# Patient Record
Sex: Female | Born: 1985 | Race: White | Hispanic: No | Marital: Married | Smoking: Never smoker
Health system: Southern US, Community
[De-identification: ages and names within clinical notes are randomized; demographics above are authoritative.]

## PROBLEM LIST (undated history)

## (undated) DIAGNOSIS — M419 Scoliosis, unspecified: Secondary | ICD-10-CM

## (undated) DIAGNOSIS — Z981 Arthrodesis status: Secondary | ICD-10-CM

## (undated) DIAGNOSIS — T7840XA Allergy, unspecified, initial encounter: Secondary | ICD-10-CM

## (undated) HISTORY — PX: LUMBAR FUSION: SHX111

## (undated) HISTORY — DX: Allergy, unspecified, initial encounter: T78.40XA

## (undated) HISTORY — PX: MOUTH SURGERY: SHX715

---

## 2010-02-26 HISTORY — PX: SPINAL FUSION: SHX223

## 2010-04-12 ENCOUNTER — Encounter: Payer: Self-pay | Admitting: Internal Medicine

## 2010-04-12 ENCOUNTER — Ambulatory Visit (INDEPENDENT_AMBULATORY_CARE_PROVIDER_SITE_OTHER): Payer: BC Managed Care – PPO | Admitting: Internal Medicine

## 2010-04-12 VITALS — BP 120/80 | HR 105 | Temp 98.6°F | Ht 66.0 in | Wt 143.0 lb

## 2010-04-12 DIAGNOSIS — Z0000 Encounter for general adult medical examination without abnormal findings: Secondary | ICD-10-CM

## 2010-04-12 DIAGNOSIS — R209 Unspecified disturbances of skin sensation: Secondary | ICD-10-CM

## 2010-04-12 DIAGNOSIS — R202 Paresthesia of skin: Secondary | ICD-10-CM

## 2010-04-12 LAB — TSH: TSH: 4.64 u[IU]/mL (ref 0.35–5.50)

## 2010-04-12 LAB — BASIC METABOLIC PANEL
BUN: 10 mg/dL (ref 6–23)
Calcium: 9.7 mg/dL (ref 8.4–10.5)
Chloride: 106 mEq/L (ref 96–112)
Creatinine, Ser: 0.9 mg/dL (ref 0.4–1.2)

## 2010-04-12 MED ORDER — TETANUS-DIPHTH-ACELL PERTUSSIS 5-2-15.5 LF-MCG/0.5 IM SUSP
0.5000 mL | Freq: Once | INTRAMUSCULAR | Status: AC
  Administered 2010-04-12: 0.5 mL via INTRAMUSCULAR

## 2010-04-12 NOTE — Assessment & Plan Note (Signed)
Obtain CBC,chem7, TSH and B12. Suspect superficial nerve impingement at ankle or foot. Avoid tennis shoes x1 month and monitor sx for resolution. Consider associated lumbar radiculopathy but feel low likelihood given presentation. Followup if no improvement or worsening.

## 2010-04-12 NOTE — Progress Notes (Signed)
  Subjective:    Patient ID: Raynaldo Opitz, female    DOB: 12-04-1985, 25 y.o.   MRN: [id number]  HPI Pt is a pleasant 25 yo female who presents to clinic for evaluation of foot numbness. Notes 2 wk h/o left plantar foot numbness without injury or trauma. H/o spinal fusion @ age 20 but has had no recent back pain or radicular leg pain. Paresthesia involves entire plantar foot but not side or dorsal. No other location of paresthesias. No exacerbating factors. Changed shoes from tennis shoes to flats or sandals recently and sx has spontaneously improved.  Has taken no medication for this. No significant PMH. Father diagnosed recently with peripheral neuropathy of unknown etiology. Sees gyn yearly for pap smears and cpe. Needs updated tetanus for work.  Reviewed PMH, PSH, medications, allergies, family hx and social hx.    Review of Systems  Constitutional: Negative for fever, chills and fatigue.  HENT: Negative for hearing loss, congestion, facial swelling and neck stiffness.   Eyes: Negative for pain, discharge, redness and visual disturbance.  Respiratory: Negative for cough, shortness of breath and wheezing.   Cardiovascular: Negative for chest pain and palpitations.  Gastrointestinal: Negative for nausea, abdominal pain and blood in stool.  Genitourinary: Negative for dysuria, frequency and difficulty urinating.  Musculoskeletal: Negative for back pain, joint swelling, arthralgias and gait problem.  Skin: Negative for color change, pallor, rash and wound.  Neurological: Positive for numbness. Negative for dizziness, tremors, seizures, speech difficulty, weakness and headaches.  Hematological: Negative for adenopathy. Does not bruise/bleed easily.  Psychiatric/Behavioral: Negative for behavioral problems and agitation. The patient is not nervous/anxious.        Objective:   Physical Exam  Constitutional: She appears well-developed and well-nourished. No distress.  HENT:  Head:  Normocephalic and atraumatic.  Right Ear: Tympanic membrane and external ear normal.  Left Ear: Tympanic membrane and external ear normal.  Nose: Nose normal.  Mouth/Throat: Oropharynx is clear and moist. No oropharyngeal exudate.  Eyes: Conjunctivae and EOM are normal. Pupils are equal, round, and reactive to light. Right eye exhibits no discharge. Left eye exhibits no discharge. No scleral icterus.  Neck: Neck supple. No thyromegaly present.  Cardiovascular: Normal rate, regular rhythm and normal heart sounds.  Exam reveals no gallop and no friction rub.   No murmur heard. Pulmonary/Chest: Effort normal and breath sounds normal. No respiratory distress. She has no wheezes. She has no rales.  Lymphadenopathy:    She has no cervical adenopathy.  Neurological: She is alert. No cranial nerve deficit. Coordination normal.       Left foot: +2 DP pulse. Sensation to light touch intact. Bilateral LE strength 5/5  Skin: Skin is warm. No rash noted. She is not diaphoretic. No erythema.  Psychiatric: She has a normal mood and affect.          Assessment & Plan:

## 2010-04-13 ENCOUNTER — Telehealth: Payer: Self-pay

## 2010-04-13 LAB — CBC WITH DIFFERENTIAL/PLATELET
Basophils Absolute: 0 10*3/uL (ref 0.0–0.1)
Basophils Relative: 0.3 % (ref 0.0–3.0)
Eosinophils Absolute: 0.1 10*3/uL (ref 0.0–0.7)
HCT: 41.6 % (ref 36.0–46.0)
Hemoglobin: 14.4 g/dL (ref 12.0–15.0)
Lymphocytes Relative: 39.7 % (ref 12.0–46.0)
Lymphs Abs: 1.9 10*3/uL (ref 0.7–4.0)
MCHC: 34.5 g/dL (ref 30.0–36.0)
MCV: 88.9 fl (ref 78.0–100.0)
Monocytes Absolute: 0.3 10*3/uL (ref 0.1–1.0)
Neutro Abs: 2.5 10*3/uL (ref 1.4–7.7)
RBC: 4.68 Mil/uL (ref 3.87–5.11)
RDW: 12.7 % (ref 11.5–14.6)

## 2010-04-13 NOTE — Telephone Encounter (Signed)
Message copied by Kyung Rudd on Thu Apr 13, 2010  2:29 PM ------      Message from: Letitia Libra, Maisie Fus      Created: Thu Apr 13, 2010  2:17 PM       Labs nl

## 2010-04-13 NOTE — Telephone Encounter (Signed)
Pt notified labs normal.

## 2012-03-02 ENCOUNTER — Encounter (HOSPITAL_COMMUNITY): Payer: Self-pay | Admitting: Emergency Medicine

## 2012-03-02 ENCOUNTER — Emergency Department (HOSPITAL_COMMUNITY)
Admission: EM | Admit: 2012-03-02 | Discharge: 2012-03-02 | Disposition: A | Discharge status: Home / Self Care | Payer: No Typology Code available for payment source | Attending: Emergency Medicine | Admitting: Emergency Medicine

## 2012-03-02 ENCOUNTER — Emergency Department (HOSPITAL_COMMUNITY): Payer: No Typology Code available for payment source

## 2012-03-02 DIAGNOSIS — S139XXA Sprain of joints and ligaments of unspecified parts of neck, initial encounter: Secondary | ICD-10-CM | POA: Insufficient documentation

## 2012-03-02 DIAGNOSIS — Z981 Arthrodesis status: Secondary | ICD-10-CM | POA: Insufficient documentation

## 2012-03-02 DIAGNOSIS — Z8739 Personal history of other diseases of the musculoskeletal system and connective tissue: Secondary | ICD-10-CM | POA: Insufficient documentation

## 2012-03-02 DIAGNOSIS — Y9389 Activity, other specified: Secondary | ICD-10-CM | POA: Insufficient documentation

## 2012-03-02 DIAGNOSIS — IMO0002 Reserved for concepts with insufficient information to code with codable children: Secondary | ICD-10-CM | POA: Insufficient documentation

## 2012-03-02 DIAGNOSIS — Z79899 Other long term (current) drug therapy: Secondary | ICD-10-CM | POA: Insufficient documentation

## 2012-03-02 HISTORY — DX: Scoliosis, unspecified: M41.9

## 2012-03-02 MED ORDER — NAPROXEN 375 MG PO TABS: 375.0000 mg | ORAL_TABLET | Freq: Two times a day (BID) | ORAL | Status: AC

## 2012-03-02 MED ORDER — CYCLOBENZAPRINE HCL 10 MG PO TABS: 10.0000 mg | ORAL_TABLET | Freq: Two times a day (BID) | ORAL | Status: DC | PRN

## 2012-03-02 MED ORDER — HYDROCODONE-ACETAMINOPHEN 5-325 MG PO TABS: 1.0000 | ORAL_TABLET | ORAL | Status: DC | PRN
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
## 2012-03-02 NOTE — ED Notes (Signed)
Patient transported to X-ray 
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
## 2012-03-02 NOTE — ED Provider Notes (Signed)
Medical screening examination/treatment/procedure(s) were performed by non-physician practitioner and as supervising physician I was immediately available for consultation/collaboration.  Hadi Dubin, MD 03/02/12 2345 
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
## 2012-06-16 ENCOUNTER — Other Ambulatory Visit: Payer: Self-pay | Admitting: Family Medicine

## 2012-06-16 DIAGNOSIS — R110 Nausea: Secondary | ICD-10-CM

## 2012-06-20 ENCOUNTER — Other Ambulatory Visit: Payer: Self-pay | Admitting: Family Medicine

## 2012-06-20 ENCOUNTER — Ambulatory Visit
Admission: RE | Admit: 2012-06-20 | Discharge: 2012-06-20 | Disposition: A | Discharge status: Home / Self Care | Payer: BC Managed Care – PPO | Source: Ambulatory Visit | Attending: Family Medicine | Admitting: Family Medicine

## 2012-06-20 DIAGNOSIS — R110 Nausea: Secondary | ICD-10-CM

## 2012-07-10 ENCOUNTER — Other Ambulatory Visit: Payer: Self-pay | Admitting: Gastroenterology

## 2012-07-10 DIAGNOSIS — K589 Irritable bowel syndrome without diarrhea: Secondary | ICD-10-CM

## 2012-07-23 ENCOUNTER — Other Ambulatory Visit: Payer: BC Managed Care – PPO

## 2012-07-23 ENCOUNTER — Ambulatory Visit
Admission: RE | Admit: 2012-07-23 | Discharge: 2012-07-23 | Disposition: A | Discharge status: Home / Self Care | Payer: BC Managed Care – PPO | Source: Ambulatory Visit | Attending: Gastroenterology | Admitting: Gastroenterology

## 2012-07-23 DIAGNOSIS — K589 Irritable bowel syndrome without diarrhea: Secondary | ICD-10-CM

## 2013-11-10 LAB — OB RESULTS CONSOLE ABO/RH: RH Type: POSITIVE

## 2013-11-10 LAB — OB RESULTS CONSOLE RUBELLA ANTIBODY, IGM: Rubella: IMMUNE

## 2013-11-10 LAB — OB RESULTS CONSOLE GC/CHLAMYDIA
Chlamydia: NEGATIVE
Gonorrhea: NEGATIVE

## 2013-11-10 LAB — OB RESULTS CONSOLE HIV ANTIBODY (ROUTINE TESTING): HIV: NONREACTIVE

## 2013-11-10 LAB — OB RESULTS CONSOLE HEPATITIS B SURFACE ANTIGEN: Hepatitis B Surface Ag: NEGATIVE

## 2013-11-10 LAB — OB RESULTS CONSOLE ANTIBODY SCREEN: ANTIBODY SCREEN: NEGATIVE

## 2014-02-26 NOTE — L&D Delivery Note (Signed)
Delivery Note At 5:18 AM a viable and healthy female was delivered via Vaginal, Spontaneous Delivery (Presentation: Right Occiput Anterior).  APGAR: 8, 9; weight  pending.   Placenta status: Intact, Spontaneous.  Cord:  with the following complications: tight [place] x one clamped on perineum.  Cord pH: na  Anesthesia: Epidural  Episiotomy: None Lacerations: Periurethral Suture Repair: 2.0 3.0 vicryl rapide Est. Blood Loss (mL): 200  Mom to postpartum.  Baby to Couplet care / Skin to Skin.  Valerie Cobb J 06/09/2014, 5:33 AM

## 2014-03-24 LAB — OB RESULTS CONSOLE RPR: RPR: NONREACTIVE

## 2014-05-14 IMAGING — RF DG UGI W/ HIGH DENSITY W/KUB
19 of 24 series · 19 of 24 positions shown · non-contrast
Comparison: None.

CLINICAL DATA: Irritable bowel syndrome.

UPPER GI SERIES W/HIGH DENSITY W/KUB
TECHNIQUE: After obtaining a scout radiograph, upper GI series
performed with high density barium and effervescent agent. Thin
barium also used.
Fluoroscopy Time: 2 minutes, 54 seconds.

[Series 1: run · 1 of 1 slices shown (1 of 19)]
[im 1/1]
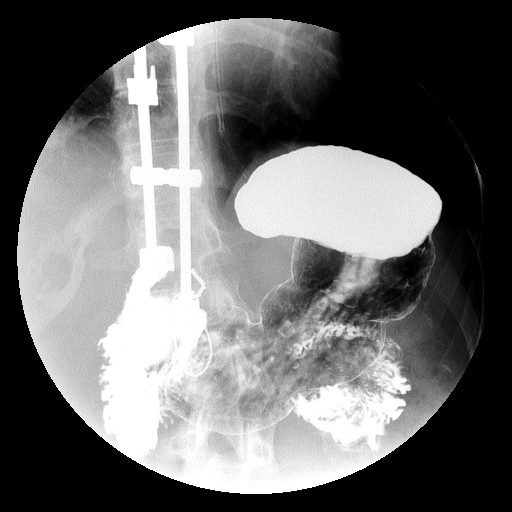

[Series 2: run · 1 of 1 slices shown (2 of 19)]
[im 1/1]
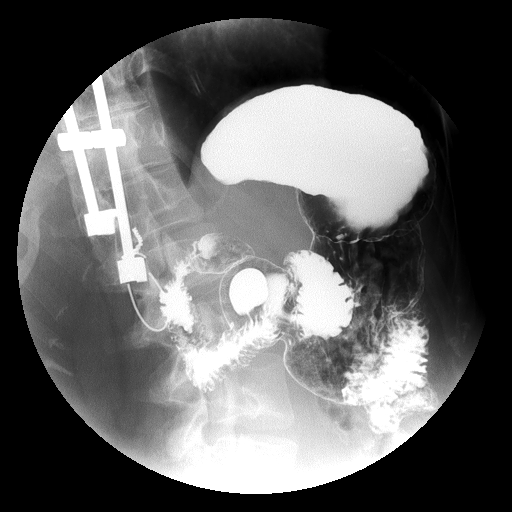

[Series 4: run · 1 of 1 slices shown (3 of 19)]
[im 1/1]
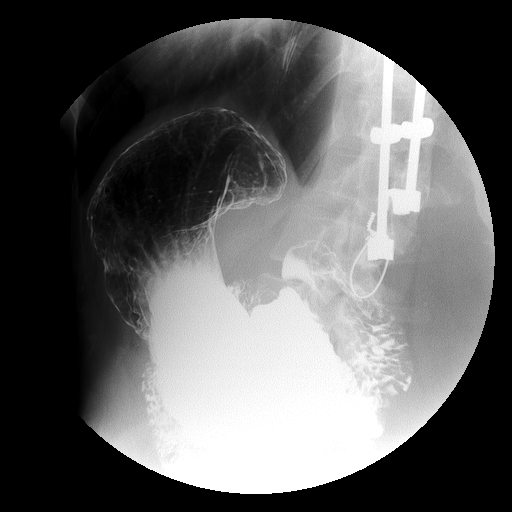

[Series 5: run · 1 of 1 slices shown (4 of 19)]
[im 1/1]
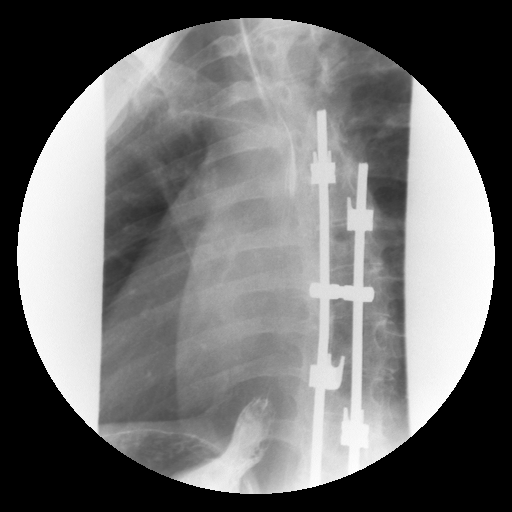

[Series 6: run · 1 of 1 slices shown (5 of 19)]
[im 1/1]
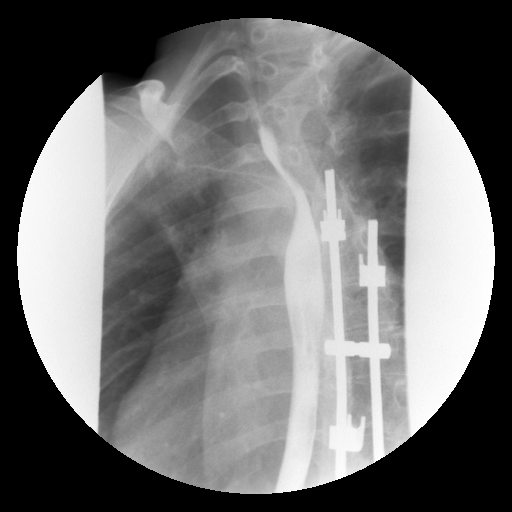

[Series 7: run · 1 of 1 slices shown (6 of 19)]
[im 1/1]
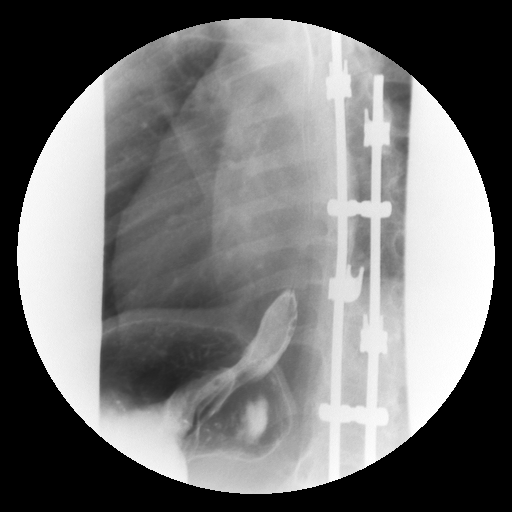

[Series 9: run · 1 of 1 slices shown (7 of 19)]
[im 1/1]
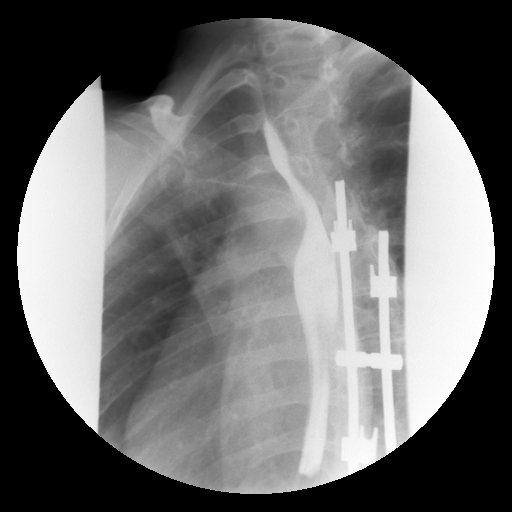

[Series 10: run · 1 of 1 slices shown (8 of 19)]
[im 1/1]
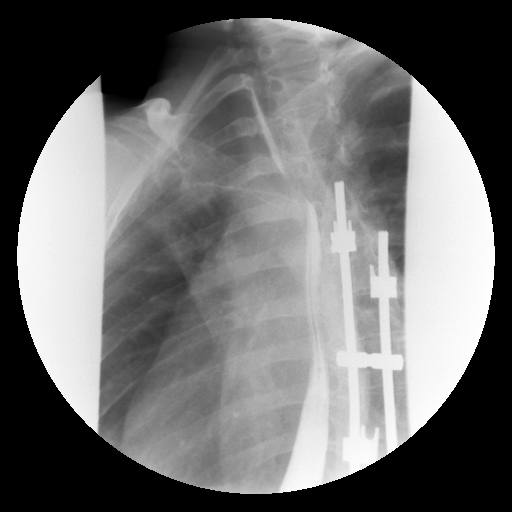

[Series 11: run · 1 of 1 slices shown (9 of 19)]
[im 1/1]
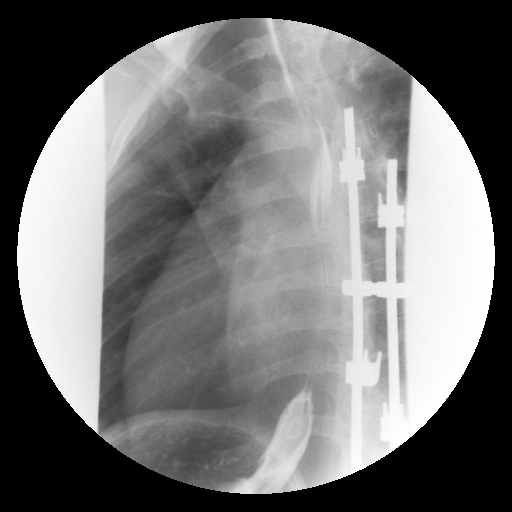

[Series 13: run · 1 of 1 slices shown (10 of 19)]
[im 1/1]
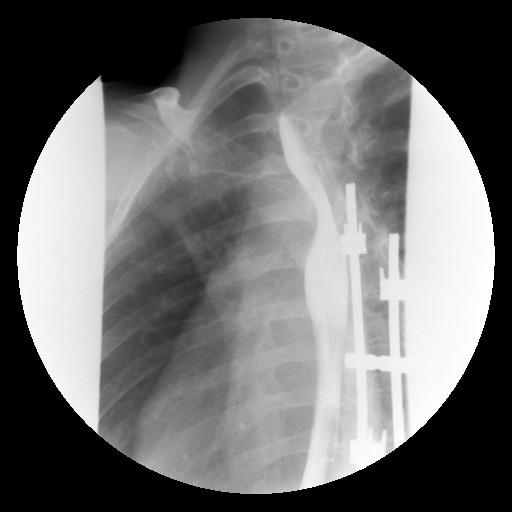

[Series 14: run · 1 of 1 slices shown (11 of 19)]
[im 1/1]
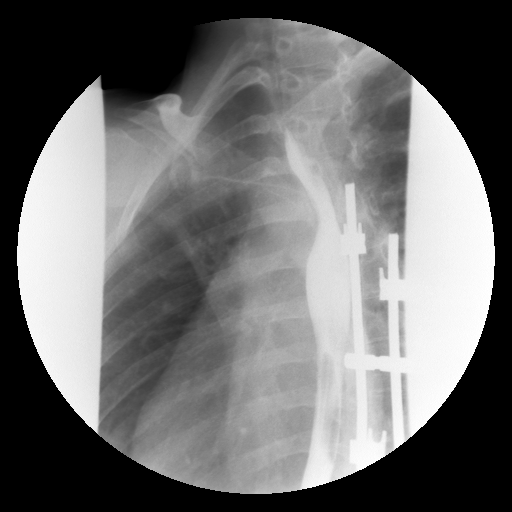

[Series 15: run · 1 of 1 slices shown (12 of 19)]
[im 1/1]
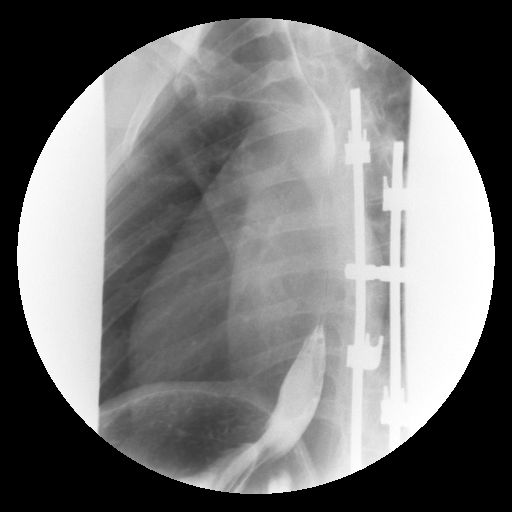

[Series 16: run · 1 of 1 slices shown (13 of 19)]
[im 1/1]
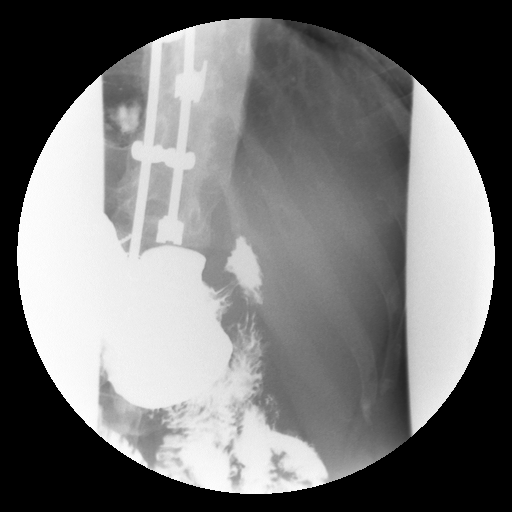

[Series 18: run · 1 of 1 slices shown (14 of 19)]
[im 1/1]
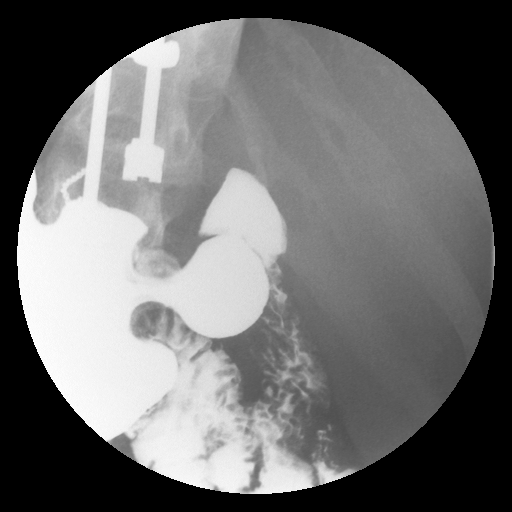

[Series 19: run · 1 of 1 slices shown (15 of 19)]
[im 1/1]
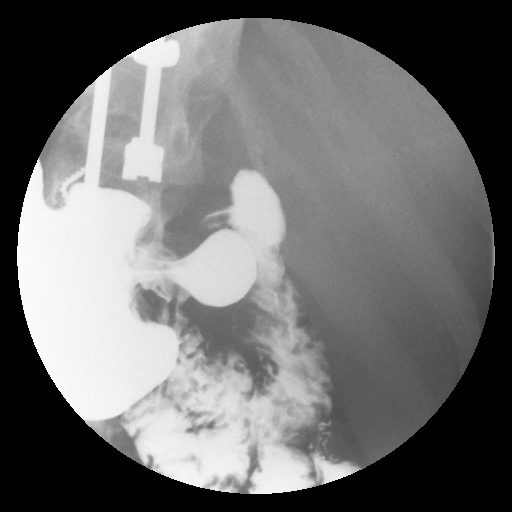

[Series 20: run · 1 of 1 slices shown (16 of 19)]
[im 1/1]
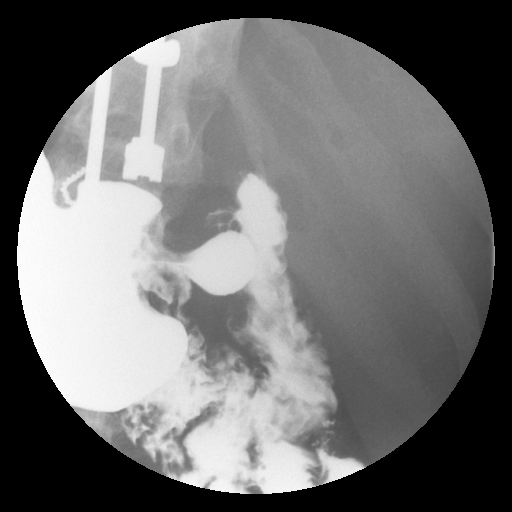

[Series 21: run · 1 of 1 slices shown (17 of 19)]
[im 1/1]
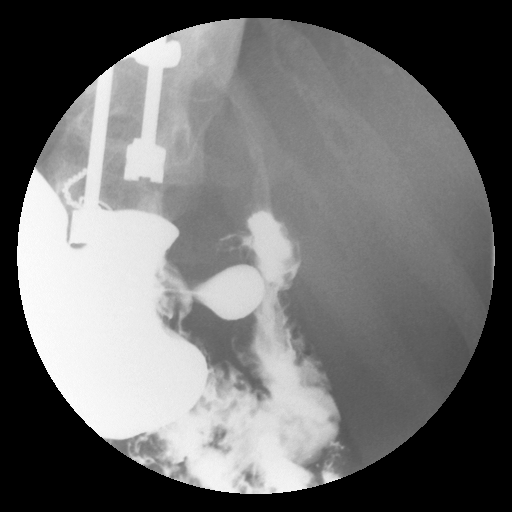

[Series 23: run · 1 of 1 slices shown (18 of 19)]
[im 1/1]
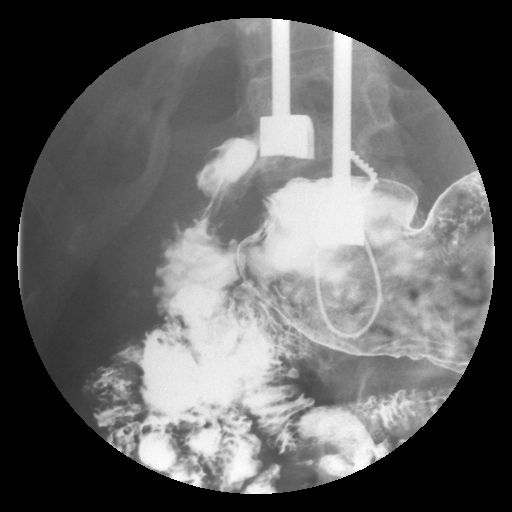

[Series 24: run · 1 of 1 slices shown (19 of 19)]
[im 1/1]
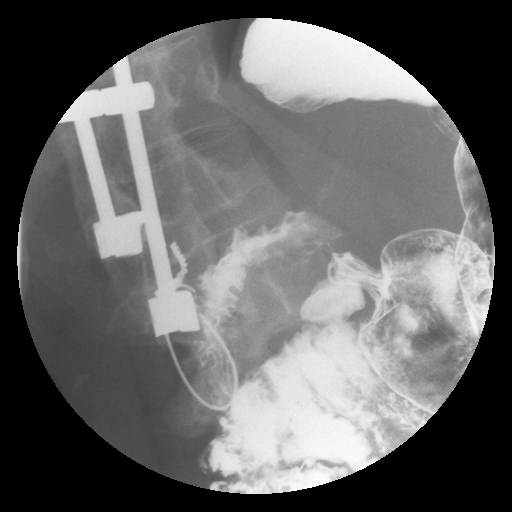

[19 of 24 positions shown; findings below may reference images not displayed]

FINDINGS: Fluoroscopic evaluation of swallowing demonstrates
disruption of primary esophageal peristaltic waves with proximal
escape.  No fixed stricture, fold thickening or reflux.

Stomach, duodenal bulb are unremarkable.  There is a small duodenal
diverticulum within the fourth portion of the duodenum.
IMPRESSION: Small duodenal diverticulum.

Nonspecific esophageal motility disorder.

## 2014-05-17 LAB — OB RESULTS CONSOLE GBS: GBS: NEGATIVE

## 2014-06-08 ENCOUNTER — Encounter (HOSPITAL_COMMUNITY): Payer: Self-pay | Admitting: *Deleted

## 2014-06-08 ENCOUNTER — Other Ambulatory Visit: Payer: Self-pay | Admitting: Obstetrics and Gynecology

## 2014-06-08 ENCOUNTER — Inpatient Hospital Stay (HOSPITAL_COMMUNITY)
Admission: AD | Admit: 2014-06-08 | Discharge: 2014-06-11 | DRG: 775 | Disposition: A | Discharge status: Home / Self Care | Payer: BLUE CROSS/BLUE SHIELD | Source: Ambulatory Visit | Attending: Obstetrics and Gynecology | Admitting: Obstetrics and Gynecology

## 2014-06-08 DIAGNOSIS — Z3A38 38 weeks gestation of pregnancy: Secondary | ICD-10-CM | POA: Diagnosis present

## 2014-06-08 DIAGNOSIS — O365930 Maternal care for other known or suspected poor fetal growth, third trimester, not applicable or unspecified: Principal | ICD-10-CM | POA: Diagnosis present

## 2014-06-08 DIAGNOSIS — Z3490 Encounter for supervision of normal pregnancy, unspecified, unspecified trimester: Secondary | ICD-10-CM

## 2014-06-08 DIAGNOSIS — Z3403 Encounter for supervision of normal first pregnancy, third trimester: Secondary | ICD-10-CM | POA: Diagnosis present

## 2014-06-08 DIAGNOSIS — O9081 Anemia of the puerperium: Secondary | ICD-10-CM | POA: Diagnosis present

## 2014-06-08 HISTORY — DX: Arthrodesis status: Z98.1

## 2014-06-08 LAB — TYPE AND SCREEN
ABO/RH(D): O POS
Antibody Screen: NEGATIVE

## 2014-06-08 LAB — CBC
HCT: 40.5 % (ref 36.0–46.0)
HEMOGLOBIN: 14.3 g/dL (ref 12.0–15.0)
MCH: 31.9 pg (ref 26.0–34.0)
MCHC: 35.3 g/dL (ref 30.0–36.0)
MCV: 90.4 fL (ref 78.0–100.0)
Platelets: 173 10*3/uL (ref 150–400)
RBC: 4.48 MIL/uL (ref 3.87–5.11)
RDW: 13.2 % (ref 11.5–15.5)
WBC: 10.2 10*3/uL (ref 4.0–10.5)

## 2014-06-08 MED ORDER — ACETAMINOPHEN 325 MG PO TABS: 650.0000 mg | ORAL_TABLET | ORAL | Status: DC | PRN

## 2014-06-08 MED ORDER — ONDANSETRON HCL 4 MG/2ML IJ SOLN: 4.0000 mg | Freq: Four times a day (QID) | INTRAMUSCULAR | Status: DC | PRN

## 2014-06-08 MED ORDER — LIDOCAINE HCL (PF) 1 % IJ SOLN
30.0000 mL | INTRAMUSCULAR | Status: DC | PRN
  Filled 2014-06-08: qty 30

## 2014-06-08 MED ORDER — OXYTOCIN 40 UNITS IN LACTATED RINGERS INFUSION - SIMPLE MED
62.5000 mL/h | INTRAVENOUS | Status: DC
  Filled 2014-06-08: qty 1000

## 2014-06-08 MED ORDER — MISOPROSTOL 25 MCG QUARTER TABLET
25.0000 ug | ORAL_TABLET | ORAL | Status: DC | PRN
  Administered 2014-06-08: 25 ug via VAGINAL
  Filled 2014-06-08: qty 0.25
  Filled 2014-06-08: qty 1
  Filled 2014-06-08: qty 0.25

## 2014-06-08 MED ORDER — EPHEDRINE 5 MG/ML INJ
10.0000 mg | INTRAVENOUS | Status: DC | PRN
  Filled 2014-06-08: qty 2

## 2014-06-08 MED ORDER — DIPHENHYDRAMINE HCL 50 MG/ML IJ SOLN: 12.5000 mg | INTRAMUSCULAR | Status: DC | PRN

## 2014-06-08 MED ORDER — LACTATED RINGERS IV SOLN
500.0000 mL | Freq: Once | INTRAVENOUS | Status: AC
  Administered 2014-06-08: 500 mL via INTRAVENOUS

## 2014-06-08 MED ORDER — PHENYLEPHRINE 40 MCG/ML (10ML) SYRINGE FOR IV PUSH (FOR BLOOD PRESSURE SUPPORT)
80.0000 ug | PREFILLED_SYRINGE | INTRAVENOUS | Status: DC | PRN
  Filled 2014-06-08: qty 2
  Filled 2014-06-08: qty 20

## 2014-06-08 MED ORDER — TERBUTALINE SULFATE 1 MG/ML IJ SOLN: 0.2500 mg | Freq: Once | INTRAMUSCULAR | Status: AC | PRN

## 2014-06-08 MED ORDER — LACTATED RINGERS IV SOLN
INTRAVENOUS | Status: DC
  Administered 2014-06-08: 23:00:00 via INTRAVENOUS

## 2014-06-08 MED ORDER — LACTATED RINGERS IV SOLN
500.0000 mL | INTRAVENOUS | Status: DC | PRN
  Administered 2014-06-08: 500 mL via INTRAVENOUS

## 2014-06-08 MED ORDER — OXYCODONE-ACETAMINOPHEN 5-325 MG PO TABS: 2.0000 | ORAL_TABLET | ORAL | Status: DC | PRN

## 2014-06-08 MED ORDER — CITRIC ACID-SODIUM CITRATE 334-500 MG/5ML PO SOLN: 30.0000 mL | ORAL | Status: DC | PRN

## 2014-06-08 MED ORDER — ZOLPIDEM TARTRATE 5 MG PO TABS: 5.0000 mg | ORAL_TABLET | Freq: Every evening | ORAL | Status: DC | PRN

## 2014-06-08 MED ORDER — OXYCODONE-ACETAMINOPHEN 5-325 MG PO TABS: 1.0000 | ORAL_TABLET | ORAL | Status: DC | PRN

## 2014-06-08 MED ORDER — FENTANYL 2.5 MCG/ML BUPIVACAINE 1/10 % EPIDURAL INFUSION (WH - ANES)
14.0000 mL/h | INTRAMUSCULAR | Status: DC | PRN
  Administered 2014-06-09: 14 mL/h via EPIDURAL
  Filled 2014-06-08: qty 125

## 2014-06-08 MED ORDER — PHENYLEPHRINE 40 MCG/ML (10ML) SYRINGE FOR IV PUSH (FOR BLOOD PRESSURE SUPPORT)
80.0000 ug | PREFILLED_SYRINGE | INTRAVENOUS | Status: DC | PRN
  Filled 2014-06-08: qty 2

## 2014-06-08 MED ORDER — OXYTOCIN BOLUS FROM INFUSION
500.0000 mL | INTRAVENOUS | Status: DC
  Administered 2014-06-09: 500 mL via INTRAVENOUS

## 2014-06-08 MED ORDER — FLEET ENEMA 7-19 GM/118ML RE ENEM: 1.0000 | ENEMA | RECTAL | Status: DC | PRN

## 2014-06-08 NOTE — H&P (Signed)
Valerie OpitzJessica Cobb is a 29 y.o. female presenting for IOL for IUGR. Sono today with poor interval growth and EFW < 7th percentile with AC 1st percentile and low afi. Delivery recommended.  Maternal Medical History:  Contractions: Onset was less than 1 hour ago.   Frequency: irregular.   Perceived severity is mild.    Fetal activity: Perceived fetal activity is normal.    Prenatal complications: IUGR.   Prenatal Complications - Diabetes: none.    OB History    Gravida Para Term Preterm AB TAB SAB Ectopic Multiple Living   1              Past Medical History  Diagnosis Date  . Allergy   . Scoliosis    Past Surgical History  Procedure Laterality Date  . Spinal fusion  13  . Lumbar fusion      midback fusion.- 6 rods in back  . Mouth surgery     Family History: family history includes Arthritis in her maternal grandmother; Cancer in her maternal grandfather; Hypertension in her father; Lupus in her maternal grandmother; Neuropathy in her father. Social History:  reports that she has never smoked. She has never used smokeless tobacco. She reports that she does not drink alcohol or use illicit drugs.   Prenatal Transfer Tool  Maternal Diabetes: No Genetic Screening: Normal Maternal Ultrasounds/Referrals: Abnormal:  Findings:   IUGR Fetal Ultrasounds or other Referrals:  None Maternal Substance Abuse:  No Significant Maternal Medications:  None Significant Maternal Lab Results:  None Other Comments:  None  Review of Systems  Constitutional: Negative.   Eyes: Negative.   Gastrointestinal: Negative.   Endo/Heme/Allergies: Negative.   All other systems reviewed and are negative.   Dilation: 2 Effacement (%): 70, 80 Station: -2 Exam by:: Miachel Rouxarmen Blair RN Blood pressure 131/75, pulse 88, temperature 98.6 F (37 C), temperature source Oral, resp. rate 16, height 5\' 6"  (1.676 m), weight 75.297 kg (166 lb), last menstrual period 09/13/2013. Maternal Exam:  Uterine Assessment:  Contraction strength is mild.  Contraction frequency is irregular.   Abdomen: Patient reports no abdominal tenderness. Fetal presentation: vertex  Introitus: Normal vulva. Normal vagina.  Ferning test: negative.  Nitrazine test: negative. Amniotic fluid character: not assessed.  Pelvis: adequate for delivery.   Cervix: Cervix evaluated by digital exam.     Physical Exam  Nursing note and vitals reviewed. Constitutional: She is oriented to person, place, and time. She appears well-developed.  HENT:  Head: Normocephalic and atraumatic.  Neck: Normal range of motion. Neck supple.  Cardiovascular: Normal rate and regular rhythm.   Respiratory: Effort normal and breath sounds normal.  GI: Soft. Bowel sounds are normal.  Genitourinary: Vagina normal and uterus normal.  Musculoskeletal: Normal range of motion.  Neurological: She is alert and oriented to person, place, and time. She has normal reflexes.  Skin: Skin is warm and dry.  Psychiatric: She has a normal mood and affect. Her behavior is normal. Judgment and thought content normal.    Prenatal labs: ABO, Rh: --/--/O POS (04/12 1704) Antibody: NEG (04/12 1704) Rubella: Immune (09/15 0000) RPR: Nonreactive (01/27 0000)  HBsAg: Negative (09/15 0000)  HIV: Non-reactive (09/15 0000)  GBS: Negative (03/21 0000)   Assessment/Plan: 38 + weeks. IUGR Admit for cytotec and IOL   Valerie Cobb J 06/08/2014, 10:39 PM

## 2014-06-08 NOTE — Progress Notes (Signed)
MD called for update on patient. Orders received. Notified MD of fetal heart rate tracing and interventions to improve. MD not concerned at this time about late decels due to frequent contractions.

## 2014-06-08 NOTE — Progress Notes (Signed)
MD in room to discuss plan of care with patient. MD not concerned at this time about decelerations and states that it is fine to proceed with plan of care of cytotec every 4 hours.

## 2014-06-09 ENCOUNTER — Inpatient Hospital Stay (HOSPITAL_COMMUNITY): Payer: BLUE CROSS/BLUE SHIELD | Admitting: Anesthesiology

## 2014-06-09 ENCOUNTER — Encounter (HOSPITAL_COMMUNITY): Payer: Self-pay

## 2014-06-09 LAB — ABO/RH: ABO/RH(D): O POS

## 2014-06-09 LAB — RPR: RPR Ser Ql: NONREACTIVE

## 2014-06-09 MED ORDER — SODIUM BICARBONATE 8.4 % IV SOLN
INTRAVENOUS | Status: DC | PRN
  Administered 2014-06-09: 3 mL via EPIDURAL

## 2014-06-09 MED ORDER — METHYLERGONOVINE MALEATE 0.2 MG PO TABS
0.2000 mg | ORAL_TABLET | ORAL | Status: DC | PRN
Start: 2014-06-09 — End: 2014-06-11

## 2014-06-09 MED ORDER — SIMETHICONE 80 MG PO CHEW: 80.0000 mg | CHEWABLE_TABLET | ORAL | Status: DC | PRN

## 2014-06-09 MED ORDER — METHYLERGONOVINE MALEATE 0.2 MG/ML IJ SOLN: 0.2000 mg | INTRAMUSCULAR | Status: DC | PRN

## 2014-06-09 MED ORDER — BUPIVACAINE HCL (PF) 0.25 % IJ SOLN
INTRAMUSCULAR | Status: DC | PRN
  Administered 2014-06-09: 3 mL via EPIDURAL

## 2014-06-09 MED ORDER — IBUPROFEN 100 MG/5ML PO SUSP
600.0000 mg | Freq: Four times a day (QID) | ORAL | Status: DC
  Administered 2014-06-09 – 2014-06-11 (×9): 600 mg via ORAL
  Filled 2014-06-09 (×13): qty 30

## 2014-06-09 MED ORDER — TETANUS-DIPHTH-ACELL PERTUSSIS 5-2.5-18.5 LF-MCG/0.5 IM SUSP: 0.5000 mL | Freq: Once | INTRAMUSCULAR | Status: DC

## 2014-06-09 MED ORDER — ONDANSETRON HCL 4 MG PO TABS
4.0000 mg | ORAL_TABLET | ORAL | Status: DC | PRN
Start: 2014-06-09 — End: 2014-06-11

## 2014-06-09 MED ORDER — IBUPROFEN 600 MG PO TABS
600.0000 mg | ORAL_TABLET | Freq: Four times a day (QID) | ORAL | Status: DC
  Filled 2014-06-09: qty 1

## 2014-06-09 MED ORDER — LIDOCAINE HCL (PF) 1 % IJ SOLN
INTRAMUSCULAR | Status: DC | PRN
  Administered 2014-06-09: 4 mL
  Administered 2014-06-09: 6 mL

## 2014-06-09 MED ORDER — OXYCODONE-ACETAMINOPHEN 5-325 MG PO TABS: 1.0000 | ORAL_TABLET | ORAL | Status: DC | PRN

## 2014-06-09 MED ORDER — ACETAMINOPHEN 325 MG PO TABS: 650.0000 mg | ORAL_TABLET | ORAL | Status: DC | PRN

## 2014-06-09 MED ORDER — SENNOSIDES-DOCUSATE SODIUM 8.6-50 MG PO TABS
2.0000 | ORAL_TABLET | ORAL | Status: DC
  Administered 2014-06-11: 2 via ORAL
  Filled 2014-06-09: qty 2

## 2014-06-09 MED ORDER — PRENATAL MULTIVITAMIN CH: 1.0000 | ORAL_TABLET | Freq: Every day | ORAL | Status: DC

## 2014-06-09 MED ORDER — COMPLETENATE 29-1 MG PO CHEW
1.0000 | CHEWABLE_TABLET | Freq: Every day | ORAL | Status: DC
  Administered 2014-06-09 – 2014-06-11 (×3): 1 via ORAL
  Filled 2014-06-09 (×4): qty 1

## 2014-06-09 MED ORDER — WITCH HAZEL-GLYCERIN EX PADS: 1.0000 "application " | MEDICATED_PAD | CUTANEOUS | Status: DC | PRN

## 2014-06-09 MED ORDER — DIBUCAINE 1 % RE OINT: 1.0000 "application " | TOPICAL_OINTMENT | RECTAL | Status: DC | PRN

## 2014-06-09 MED ORDER — OXYCODONE-ACETAMINOPHEN 5-325 MG PO TABS: 2.0000 | ORAL_TABLET | ORAL | Status: DC | PRN

## 2014-06-09 MED ORDER — DIPHENHYDRAMINE HCL 25 MG PO CAPS: 25.0000 mg | ORAL_CAPSULE | Freq: Four times a day (QID) | ORAL | Status: DC | PRN

## 2014-06-09 MED ORDER — ONDANSETRON HCL 4 MG/2ML IJ SOLN: 4.0000 mg | INTRAMUSCULAR | Status: DC | PRN

## 2014-06-09 MED ORDER — BENZOCAINE-MENTHOL 20-0.5 % EX AERO
1.0000 "application " | INHALATION_SPRAY | CUTANEOUS | Status: DC | PRN
  Administered 2014-06-09: 1 via TOPICAL
  Filled 2014-06-09: qty 56

## 2014-06-09 MED ORDER — LANOLIN HYDROUS EX OINT: TOPICAL_OINTMENT | CUTANEOUS | Status: DC | PRN

## 2014-06-09 MED ORDER — ZOLPIDEM TARTRATE 5 MG PO TABS: 5.0000 mg | ORAL_TABLET | Freq: Every evening | ORAL | Status: DC | PRN

## 2014-06-09 NOTE — Progress Notes (Signed)
Interval Note:  S: Feels tired. Pain controlled. Tolerating po.  O: VSS  A: S/p SVD  P: PPD #0     Continue routine postpartum care.

## 2014-06-09 NOTE — Anesthesia Procedure Notes (Signed)

## 2014-06-09 NOTE — Anesthesia Preprocedure Evaluation (Signed)
Anesthesia Evaluation  Patient identified by MRN, date of birth, ID band Patient awake    Reviewed: Allergy & Precautions, H&P , Patient's Chart, lab work & pertinent test results  Airway Mallampati: II  TM Distance: >3 FB Neck ROM: full    Dental  (+) Teeth Intact   Pulmonary  breath sounds clear to auscultation        Cardiovascular Rhythm:regular Rate:Normal     Neuro/Psych    GI/Hepatic   Endo/Other    Renal/GU      Musculoskeletal   Abdominal   Peds  Hematology   Anesthesia Other Findings  SPINAL FUSION.... May make Epidural challenging. Discussed with patient     Reproductive/Obstetrics (+) Pregnancy                             Anesthesia Physical Anesthesia Plan  ASA: II  Anesthesia Plan: Epidural   Post-op Pain Management:    Induction:   Airway Management Planned:   Additional Equipment:   Intra-op Plan:   Post-operative Plan:   Informed Consent: I have reviewed the patients History and Physical, chart, labs and discussed the procedure including the risks, benefits and alternatives for the proposed anesthesia with the patient or authorized representative who has indicated his/her understanding and acceptance.   Dental Advisory Given  Plan Discussed with:   Anesthesia Plan Comments: (Labs checked- platelets confirmed with RN in room. Fetal heart tracing, per RN, reported to be stable enough for sitting procedure. Discussed epidural, and patient consents to the procedure:  included risk of possible headache,backache, failed block, allergic reaction, and nerve injury. This patient was asked if she had any questions or concerns before the procedure started.)        Anesthesia Quick Evaluation

## 2014-06-09 NOTE — Anesthesia Postprocedure Evaluation (Signed)
  Anesthesia Post-op Note  Patient: Valerie Cobb  Procedure(s) Performed: * No procedures listed *  Patient Location: PACU and Mother/Baby  Anesthesia Type:Epidural  Level of Consciousness: awake, alert  and oriented  Airway and Oxygen Therapy: Patient Spontanous Breathing  Post-op Pain: mild  Post-op Assessment: Post-op Vital signs reviewed, Patient's Cardiovascular Status Stable, Respiratory Function Stable, No signs of Nausea or vomiting, Adequate PO intake, Pain level controlled, No headache, No backache, No residual numbness and No residual motor weakness  Post-op Vital Signs: Reviewed and stable  Last Vitals:  Filed Vitals:   06/09/14 0715  BP: 129/82  Pulse: 79  Temp: 36.3 C  Resp: 20    Complications: No apparent anesthesia complications

## 2014-06-09 NOTE — Progress Notes (Signed)
Bladder scan done at 1600 due to uterus deviation slightly to right side. Scan showed 34ml, pt had emptied bladder twice in toilet.

## 2014-06-10 ENCOUNTER — Encounter (HOSPITAL_COMMUNITY): Payer: Self-pay | Admitting: *Deleted

## 2014-06-10 LAB — CBC
HEMATOCRIT: 30.4 % — AB (ref 36.0–46.0)
Hemoglobin: 10.2 g/dL — ABNORMAL LOW (ref 12.0–15.0)
MCH: 31 pg (ref 26.0–34.0)
MCHC: 33.6 g/dL (ref 30.0–36.0)
MCV: 92.4 fL (ref 78.0–100.0)
Platelets: 158 10*3/uL (ref 150–400)
RBC: 3.29 MIL/uL — ABNORMAL LOW (ref 3.87–5.11)
RDW: 13.4 % (ref 11.5–15.5)
WBC: 12.6 10*3/uL — AB (ref 4.0–10.5)

## 2014-06-10 NOTE — Progress Notes (Signed)
Patient ID: Raynaldo OpitzJessica Doyon, female   DOB: 1985/10/20, 29 y.o.   MRN: [id number] PPD # 1 SVD  S:  Reports feeling rested now after sleeping a little this morning             Tolerating po/ No nausea or vomiting             Bleeding is light             Pain controlled with ibuprofen (OTC)             Up ad lib / ambulatory / voiding without difficulties    Newborn  Information for the patient's newborn:  Hodapp, Girl Shanda BumpsJessica [[id number]][[id number]]  female  breast feeding     O:  A & O x 3, in no apparent distress              VS:  Filed Vitals:   06/09/14 0815 06/09/14 1200 06/09/14 2119 06/10/14 0613  BP: 123/85 128/91 132/89 127/86  Pulse: 85 87 91 71  Temp: 97.4 F (36.3 C) 98 F (36.7 C) 98 F (36.7 C) 98.1 F (36.7 C)  TempSrc: Oral Oral Oral Oral  Resp: 20 18 16 20   Height:      Weight:      SpO2:  99%      LABS:  Recent Labs  06/08/14 1610 06/10/14 0550  WBC 10.2 12.6*  HGB 14.3 10.2*  HCT 40.5 30.4*  PLT 173 158    Blood type: O POS (04/12 1704)  Rubella: Immune (09/15 0000)   I&O: I/O last 3 completed shifts: In: -  Out: 700 [Urine:400; Blood:300]             Lungs: Clear and unlabored  Heart: regular rate and rhythm / no murmurs  Abdomen: soft, non-tender, non-distended              Fundus: firm, non-tender, U-1  Perineum: Periurethral repair healing well  Lochia: minimal  Extremities: no edema, no calf pain or tenderness, no Homans    A/P: PPD # 1  28 y.o., G1P1001   Principal Problem:    Postpartum care following vaginal delivery with Periurethral laceration (4/13)  Mild ABL Anemia - stable   Doing well - stable status  Routine post partum orders  Anticipate discharge tomorrow    Raelyn MoraAWSON, Faustine Tates, M, MSN, CNM 06/10/2014, 8:22 AM

## 2014-06-10 NOTE — Lactation Note (Signed)
This note was copied from the chart of Valerie Lockheed MartinJessica Kievit. Lactation Consultation Note  Follow up visit at 38 hours of age. Mom reports baby has been gassy and not doing as well at the breast.  Pecola LeisureBaby is 5#5oz and has not had a stool in the past 24 hours after 9 feedings recorded in the past 24 hours.  Mom reports she is more comfortable sitting on side of bed to feed baby.  Hand expression yields about 2ml.  Baby last fed about 3 hours ago.  Visitor holding baby awakened baby and placed STS with mom.  Assisted with latch onto left breast in cross cradle hold.  Baby first latched shallow and instructed mom on how to properly unlatch baby.  Assisted when baby had wide open mouth and baby latched deeply well.  Mom has some bruising noted on both nipple tips.  Offered pillows for support.  Baby has a strong, rhythmic suck for about 3 minutes and then needed stimulation and re-latching over the rest of feeding.   Pointed out nutritive and non nutritive sucking for mom to be aware of. Concerns of baby's stamina with feedings being poor and low weight encouraged mom to post pump and supplement with EBM and formula as needed.  Green LPI parent sheet given and discussed with family in how it relates to this low birth weight baby.  DEBP set up with instructions on frequency and cleaning.  Mom to allow baby to feed on demand up to 30 minutes and wake baby every 3 hours if needed.  Mom to post pump and supplement with foley cup about every 3 hours and mom to pump on preemie setting followed by hand expression.  Mom reports she feels overwhelmed, but is able to verbalize instructions in detail and has good family support.  Mom to call insurance about a DEBP for home.  Assisted with foley cup feeding of 5mls formula and baby appeared satisfied.  Encouraged parents to increase amount to 10-3120mls according to age and supplementation guidelines.  Report given to RN.  Mom to call for assist as needed.     Patient Name: Valerie Raynaldo OpitzJessica  Cobb ZOXWR'UToday's Date: 06/10/2014 Reason for consult: Follow-up assessment;Difficult latch;Breast/nipple pain;Infant < 6lbs   Maternal Data Has patient been taught Hand Expression?: Yes Does the patient have breastfeeding experience prior to this delivery?: No  Feeding Feeding Type: Breast Fed Length of feed: 30 min (15 well)  LATCH Score/Interventions Latch: Repeated attempts needed to sustain latch, nipple held in mouth throughout feeding, stimulation needed to elicit sucking reflex. Intervention(s): Adjust position;Assist with latch;Breast massage;Breast compression  Audible Swallowing: A few with stimulation Intervention(s): Skin to skin;Hand expression;Alternate breast massage  Type of Nipple: Everted at rest and after stimulation  Comfort (Breast/Nipple): Soft / non-tender     Hold (Positioning): Assistance needed to correctly position infant at breast and maintain latch. Intervention(s): Breastfeeding basics reviewed;Support Pillows;Position options;Skin to skin  LATCH Score: 7  Lactation Tools Discussed/Used Pump Review: Setup, frequency, and cleaning;Milk Storage Date initiated:: 06/10/14   Consult Status Consult Status: Follow-up Date: 06/11/14 Follow-up type: In-patient    Shoptaw, Arvella MerlesJana Lynn 06/10/2014, 7:23 PM

## 2014-06-11 MED ORDER — IBUPROFEN 100 MG/5ML PO SUSP: 600.0000 mg | Freq: Four times a day (QID) | ORAL | Status: AC

## 2014-06-11 NOTE — Discharge Summary (Signed)
Obstetric Discharge Summary  Reason for Admission: onset of labor Prenatal Procedures: none Intrapartum Procedures: spontaneous vaginal delivery Postpartum Procedures: none Complications-Operative and Postpartum: none HEMOGLOBIN  Date Value Ref Range Status  06/10/2014 10.2* 12.0 - 15.0 g/dL Final    Comment:    DELTA CHECK NOTED REPEATED TO VERIFY    HCT  Date Value Ref Range Status  06/10/2014 30.4* 36.0 - 46.0 % Final    Physical Exam:  General: alert, cooperative and no distress Lochia: appropriate Uterine Fundus: firm Incision: healing well - periurethral reapir DVT Evaluation: No evidence of DVT seen on physical exam.  Discharge Diagnoses: Term Pregnancy-delivered  Discharge Information: Date: 06/11/2014 Activity: pelvic rest Diet: routine Medications: PNV and Ibuprofen Condition: stable Instructions: refer to practice specific booklet Discharge to: home Follow-up Information    Follow up with Valerie AdenAAVON,RICHARD J, MD. Schedule an appointment as soon as possible for a visit in 6 weeks.   Specialty:  Obstetrics and Gynecology   Contact information:   [address] [place] [address] [phone]       Newborn Data: Live born female  Birth Weight: 5 lb 9.2 oz (2529 g) APGAR: 9, 9  Home with mother.  Valerie Cobb 06/11/2014, 12:09 PM

## 2014-06-11 NOTE — Progress Notes (Signed)
   S:  Reports feeling well             Tolerating po/ No nausea or vomiting             Bleeding is light             Pain controlled withibuprofen (OTC)             Up ad lib / ambulatory / voiding QS  Newborn breast feeding  / Circumcision NA  O:               VS: BP 127/84 mmHg  Pulse 93  Temp(Src) 98.2 F (36.8 C) (Oral)  Resp 18  Ht 5\' 6"  (1.676 m)  Wt 75.297 kg (166 lb)  BMI 26.81 kg/m2  SpO2 99%  LMP 09/13/2013 (LMP Unknown)  Breastfeeding? Unknown   LABS:              Recent Labs  06/08/14 1610 06/10/14 0550  WBC 10.2 12.6*  HGB 14.3 10.2*  PLT 173 158               Blood type: --/--/O POS, O POS (04/12 1704)  Rubella: Immune (09/15 0000)                                Physical Exam:             Alert and oriented X3  Abdomen: soft, non-tender, non-distended              Fundus: firm, non-tender, U-2  Perineum: intact, periurethral repair well approximated  Lochia: light, no odor  Extremities: no edema, no calf pain or tenderness    A: PPD # 2  Doing well - stable status  Newborn bilirubin level elevated-waiting on peds to make a decision if baby can be discharged.   P: Routine post partum orders  Anticipate discharge today if bili level acceptable, otherwise room-in with newborn  [name], Akilah Cureton CNM, MSN, [facility] 06/11/2014, 12:07 PM

## 2014-06-11 NOTE — Lactation Note (Signed)
This note was copied from the chart of Valerie Lockheed MartinJessica Newnam. Lactation Consultation Note: Infant is 5852 hours old. Mother states that she has been rousing infant every 2-3 hours. She states that infant is feeding better today. Mother is able to hand express large amt of colostrum. Mothers breast are slightly filling.  Assist mother with cross cradle hold. Observed infant for 17 mins. with deep suckling and audible swallows. Infant latched to alternate breast for another 15 mins.  Mother taught good breast compression . Infant had large wet and dirty diaper after feeding.  Mother advised to continue to supplement with any amt of EBM and the give formula according to supplemental guidelines. Mother has only been giving 10 ml of formula. Assist mother with increasing to 20 ml with this feeding. Infant took 5ml formula while on breast with curved tip and another 10 ml formula with gloved finger. Remainder of supplement to be given with a cup.  Pt mother in law  at bedside for assistance and all teaching. Mother plans to rent an electric breast pump. Discussed cluster feeding. Mother was scheduled for an outpatient follow up on April 19 at 1 pm. Reviewed treatment for engorgement and breast milk storage guidelines. Mother receptive to all teaching.    Patient Name: Valerie Raynaldo OpitzJessica Cobb ZOXWR'UToday's Date: 06/11/2014 Reason for consult: Follow-up assessment   Maternal Data    Feeding Feeding Type: Formula Length of feed: 12 min  LATCH Score/Interventions                      Lactation Tools Discussed/Used     Consult Status      Valerie Cobb, Valerie Cobb 06/11/2014, 10:45 AM

## 2014-06-15 ENCOUNTER — Ambulatory Visit (HOSPITAL_COMMUNITY)
Admit: 2014-06-15 | Discharge: 2014-06-15 | Disposition: A | Discharge status: Home / Self Care | Payer: BLUE CROSS/BLUE SHIELD | Attending: Obstetrics and Gynecology | Admitting: Obstetrics and Gynecology

## 2014-06-15 NOTE — Lactation Note (Signed)
Lactation Consult  Mother's reason for visit: mother was scheduled for a follow up visit on day of discharge. Infant had a loss of 7%. Visit Type: feeding assessment  Appointment Notes:  Mother had lots of questions about thrush and yeast. She states she wanted ideas to prevent yeast and thrush. Mother states she has lots of friends that have had yeast. Mother also had questions about a pumping schedule. Mother unsure if she will return to work.  Mother fed infant before she came for consult. Lots of teaching with mother and answered all mothers questions.   At end of consult. Valerie Cobb became hungry and infant was fed on the left breast Observed good deep latch with frequent suckling pattern and audible swallows for 14-15 mins.   Consult:  Initial Lactation Consultant:  Michel Bickers  ________________________________________________________________________  Valerie Cobb Name: Valerie Cobb Date of Birth: 06/09/2013 Pediatrician:Dr Twisilton Gender: female Gestational Age: [redacted]w[redacted]d (At Birth) Birth Weight: 5 lb 9.2 oz (2529 g) Weight at Discharge: Weight: 5 lb 3.3 oz (2360 g)Date of Discharge: 06/11/2014 [address] [facility] Weights   06/09/14 0518 06/10/14 0102 06/10/14 2335  Weight: 5 lb 9.2 oz (2529 g) 5 lb 5.4 oz (2420 g) 5 lb 3.3 oz (2360 g)    [id number]} Weight today: 1-61,0960 with clothes at mothers request     Admission Information     Provider Service Admission Date    Valerie Lopes, MD Newborn 06/09/2013          ADT Events       Unit Room Bed Service Event    06/09/14 0518 [facility] [id number] Newborn Admission    06/09/14 0522 [facility] [id number] Newborn Transfer Out    06/09/14 0522 [facility] 9150 [id number] Newborn Transfer In    06/11/14 1730 [facility] 9150 [id number] Newborn Discharge      Weight Information (last 3 days) before discharge     Date/Time   Weight   BSA (Calculated - sq m)   BMI (Calculated) Who        06/10/14 2335  5 lb 3.3 oz (2360 g)  --  -- AB     06/10/14 0102  5 lb 5.4 oz (2420 g)  --  -- MA     06/09/14 0518  5 lb 9.2 oz (2529 g)  --  -- CW           Weights    Go to now       06/06/14 - Today         One Day Eight Hours  View All      Time:  0518 0102 2335    Weight   Weight  5 lb 9... 5 lb 5... 5 lb 3...  Weight               ________________________________________________________________________  Mother's Name: Deshauna Allcorn Type of delivery:  vaginal del Breastfeeding Experience:  none Maternal Medical Conditions:  None Maternal Medications:  Prenatal vits  ________________________________________________________________________  Breastfeeding History (Post Discharge)  Frequency of breastfeeding:  Every 2-2.5 hours Duration of feeding:  15-30 mins  Patient does not supplement or pump.  Infant Intake and Output Assessment  Voids:  10-12 24  hrs.  Color:  Clear yellow Stools:  10-12  24  hrs.  Color:  Yellow  ________________________________________________________________________  Maternal Breast Assessment  Breast:  Full Nipple:  Erect Pain level:  0 Pain interventions:  Bra  _______________________________________________________________________ Feeding Assessment/Evaluation  Initial feeding assessment:  Infant's oral  assessment:  WNL  Positioning:  Cross cradle Left breast  LATCH documentation:  Latch:  2 = Grasps breast easily, tongue down, lips flanged, rhythmical sucking.  Audible swallowing:  2 = Spontaneous and intermittent  Type of nipple:  2 = Everted at rest and after stimulation  Comfort (Breast/Nipple):  1 = Filling, red/small blisters or bruises, mild/mod discomfort  Hold (Positioning):  2 = No assistance needed to correctly position infant at breast  LATCH score:  9  Attached assessment:  Deep  Lips flanged:  Yes.    Lips untucked:  Yes.    Suck assessment:  nurtritive     Pre-feed weight:  2638 g  (5 lb. 13 oz.), infant weighed with clothes on at mothers request. Post-feed weight: 2706   g ( 5 lb.15.5 Oz.)  Amount transferred: 68 ml, infant fed on one breast only. Nursed well. Mother very happy and content.  Total amount transferred: 68 ml

## 2018-10-10 ENCOUNTER — Other Ambulatory Visit: Payer: Self-pay

## 2018-10-10 DIAGNOSIS — Z20822 Contact with and (suspected) exposure to covid-19: Secondary | ICD-10-CM

## 2018-10-12 LAB — NOVEL CORONAVIRUS, NAA: SARS-CoV-2, NAA: NOT DETECTED

## 2018-12-02 ENCOUNTER — Other Ambulatory Visit: Payer: Self-pay

## 2018-12-02 DIAGNOSIS — Z20822 Contact with and (suspected) exposure to covid-19: Secondary | ICD-10-CM

## 2018-12-04 LAB — NOVEL CORONAVIRUS, NAA: SARS-CoV-2, NAA: NOT DETECTED

## 2021-08-03 DIAGNOSIS — K5900 Constipation, unspecified: Secondary | ICD-10-CM | POA: Diagnosis not present

## 2021-08-03 DIAGNOSIS — K219 Gastro-esophageal reflux disease without esophagitis: Secondary | ICD-10-CM | POA: Diagnosis not present

## 2021-08-23 DIAGNOSIS — Z0000 Encounter for general adult medical examination without abnormal findings: Secondary | ICD-10-CM | POA: Diagnosis not present

## 2021-09-28 DIAGNOSIS — L658 Other specified nonscarring hair loss: Secondary | ICD-10-CM | POA: Diagnosis not present

## 2021-09-28 DIAGNOSIS — D2372 Other benign neoplasm of skin of left lower limb, including hip: Secondary | ICD-10-CM | POA: Diagnosis not present

## 2022-01-04 DIAGNOSIS — M79672 Pain in left foot: Secondary | ICD-10-CM | POA: Diagnosis not present

## 2022-01-04 DIAGNOSIS — M79671 Pain in right foot: Secondary | ICD-10-CM | POA: Diagnosis not present

## 2022-03-04 DIAGNOSIS — J029 Acute pharyngitis, unspecified: Secondary | ICD-10-CM | POA: Diagnosis not present

## 2022-08-02 DIAGNOSIS — I87391 Chronic venous hypertension (idiopathic) with other complications of right lower extremity: Secondary | ICD-10-CM | POA: Diagnosis not present

## 2022-08-02 DIAGNOSIS — M7989 Other specified soft tissue disorders: Secondary | ICD-10-CM | POA: Diagnosis not present

## 2022-08-02 DIAGNOSIS — I781 Nevus, non-neoplastic: Secondary | ICD-10-CM | POA: Diagnosis not present

## 2022-08-27 DIAGNOSIS — Z0000 Encounter for general adult medical examination without abnormal findings: Secondary | ICD-10-CM | POA: Diagnosis not present

## 2022-08-27 DIAGNOSIS — Z13220 Encounter for screening for lipoid disorders: Secondary | ICD-10-CM | POA: Diagnosis not present

## 2022-08-27 DIAGNOSIS — Z23 Encounter for immunization: Secondary | ICD-10-CM | POA: Diagnosis not present

## 2022-08-27 DIAGNOSIS — Z131 Encounter for screening for diabetes mellitus: Secondary | ICD-10-CM | POA: Diagnosis not present

## 2022-08-27 DIAGNOSIS — Z1329 Encounter for screening for other suspected endocrine disorder: Secondary | ICD-10-CM | POA: Diagnosis not present

## 2022-08-28 DIAGNOSIS — Z01419 Encounter for gynecological examination (general) (routine) without abnormal findings: Secondary | ICD-10-CM | POA: Diagnosis not present

## 2022-08-29 DIAGNOSIS — Z124 Encounter for screening for malignant neoplasm of cervix: Secondary | ICD-10-CM | POA: Diagnosis not present

## 2022-09-03 DIAGNOSIS — R92343 Mammographic extreme density, bilateral breasts: Secondary | ICD-10-CM | POA: Diagnosis not present

## 2022-09-03 DIAGNOSIS — N6310 Unspecified lump in the right breast, unspecified quadrant: Secondary | ICD-10-CM | POA: Diagnosis not present

## 2022-09-03 DIAGNOSIS — N6001 Solitary cyst of right breast: Secondary | ICD-10-CM | POA: Diagnosis not present

## 2022-09-22 DIAGNOSIS — N898 Other specified noninflammatory disorders of vagina: Secondary | ICD-10-CM | POA: Diagnosis not present

## 2022-09-22 DIAGNOSIS — Z3202 Encounter for pregnancy test, result negative: Secondary | ICD-10-CM | POA: Diagnosis not present

## 2022-09-22 DIAGNOSIS — N760 Acute vaginitis: Secondary | ICD-10-CM | POA: Diagnosis not present

## 2022-10-17 DIAGNOSIS — L700 Acne vulgaris: Secondary | ICD-10-CM | POA: Diagnosis not present

## 2022-10-17 DIAGNOSIS — D225 Melanocytic nevi of trunk: Secondary | ICD-10-CM | POA: Diagnosis not present

## 2022-10-17 DIAGNOSIS — D2262 Melanocytic nevi of left upper limb, including shoulder: Secondary | ICD-10-CM | POA: Diagnosis not present

## 2022-10-17 DIAGNOSIS — D2261 Melanocytic nevi of right upper limb, including shoulder: Secondary | ICD-10-CM | POA: Diagnosis not present

## 2023-07-15 ENCOUNTER — Ambulatory Visit (INDEPENDENT_AMBULATORY_CARE_PROVIDER_SITE_OTHER): Payer: Self-pay

## 2023-07-15 DIAGNOSIS — R6881 Early satiety: Secondary | ICD-10-CM | POA: Diagnosis not present

## 2023-07-15 DIAGNOSIS — Z800 Family history of malignant neoplasm of digestive organs: Secondary | ICD-10-CM | POA: Diagnosis not present

## 2023-07-15 DIAGNOSIS — K640 First degree hemorrhoids: Secondary | ICD-10-CM | POA: Diagnosis not present

## 2023-07-15 DIAGNOSIS — K219 Gastro-esophageal reflux disease without esophagitis: Secondary | ICD-10-CM | POA: Diagnosis not present

## 2023-07-15 DIAGNOSIS — R1310 Dysphagia, unspecified: Secondary | ICD-10-CM | POA: Diagnosis not present

## 2023-07-15 DIAGNOSIS — D128 Benign neoplasm of rectum: Secondary | ICD-10-CM | POA: Diagnosis present
